# Patient Record
Sex: Male | Born: 2000
Health system: Southern US, Community
[De-identification: ages and names within clinical notes are randomized; demographics above are authoritative.]

## PROBLEM LIST (undated history)

## (undated) DIAGNOSIS — Z8781 Personal history of (healed) traumatic fracture: Secondary | ICD-10-CM

## (undated) HISTORY — DX: Personal history of (healed) traumatic fracture: Z87.81

## (undated) HISTORY — PX: DENTAL RESTORATION/EXTRACTION WITH X-RAY: SHX5796

---
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
## 2021-01-01 ENCOUNTER — Other Ambulatory Visit (HOSPITAL_BASED_OUTPATIENT_CLINIC_OR_DEPARTMENT_OTHER): Payer: Self-pay | Admitting: Internal Medicine

## 2021-01-01 ENCOUNTER — Ambulatory Visit: Payer: 59 | Attending: Internal Medicine

## 2021-01-01 DIAGNOSIS — Z23 Encounter for immunization: Secondary | ICD-10-CM

## 2021-01-01 NOTE — Progress Notes (Signed)
   Covid-19 Vaccination Clinic  Name:  Jeffrey Brown    MRN: [id number] DOB: 2001/07/31  01/01/2021  [name] was observed post Covid-19 immunization for 15 minutes without incident. He was provided with Vaccine Information Sheet and instruction to access the V-Safe system.   [name] was instructed to call 911 with any severe reactions post vaccine: Marland Kitchen Difficulty breathing  . Swelling of face and throat  . A fast heartbeat  . A bad rash all over body  . Dizziness and weakness   Immunizations Administered    Name Date Dose VIS Date Route   Pfizer COVID-19 Vaccine 01/01/2021  2:04 PM 0.3 mL 10/14/2020 Intramuscular   Manufacturer: [employer], [employer]   Lot: [id number]   NDC: [address]-7

## 2021-01-04 MED FILL — PFIZER-BIONTECH COVID-19 VA: 30 | 21 days supply | Qty: 0 | Fill #0

## 2021-05-12 DIAGNOSIS — F640 Transsexualism: Secondary | ICD-10-CM | POA: Diagnosis not present

## 2021-05-12 DIAGNOSIS — Z79899 Other long term (current) drug therapy: Secondary | ICD-10-CM | POA: Diagnosis not present

## 2021-08-11 ENCOUNTER — Other Ambulatory Visit (HOSPITAL_COMMUNITY): Payer: Self-pay

## 2021-08-11 DIAGNOSIS — Z79899 Other long term (current) drug therapy: Secondary | ICD-10-CM | POA: Diagnosis not present

## 2021-08-11 DIAGNOSIS — F640 Transsexualism: Secondary | ICD-10-CM | POA: Diagnosis not present

## 2021-08-11 MED ORDER — ESTRADIOL 2 MG PO TABS
2.0000 mg | ORAL_TABLET | Freq: Two times a day (BID) | ORAL | 0 refills | Status: DC
  Filled 2021-08-11: qty 50, 25d supply, fill #0
  Filled 2021-08-11: qty 130, 65d supply, fill #0
  Filled 2021-08-25: qty 180, 90d supply, fill #0

## 2021-08-11 MED ORDER — SPIRONOLACTONE 100 MG PO TABS
100.0000 mg | ORAL_TABLET | Freq: Two times a day (BID) | ORAL | 0 refills | Status: DC
Start: 2021-08-11 — End: 2021-10-06
  Filled 2021-08-11 – 2021-08-25 (×2): qty 180, 90d supply, fill #0

## 2021-08-19 ENCOUNTER — Other Ambulatory Visit (HOSPITAL_COMMUNITY): Payer: Self-pay

## 2021-08-25 ENCOUNTER — Other Ambulatory Visit (HOSPITAL_COMMUNITY): Payer: Self-pay

## 2021-10-06 ENCOUNTER — Other Ambulatory Visit (HOSPITAL_COMMUNITY): Payer: Self-pay

## 2021-10-06 DIAGNOSIS — F640 Transsexualism: Secondary | ICD-10-CM | POA: Diagnosis not present

## 2021-10-06 DIAGNOSIS — F649 Gender identity disorder, unspecified: Secondary | ICD-10-CM | POA: Diagnosis not present

## 2021-10-06 DIAGNOSIS — Z79899 Other long term (current) drug therapy: Secondary | ICD-10-CM | POA: Diagnosis not present

## 2021-10-06 MED ORDER — SPIRONOLACTONE 100 MG PO TABS
100.0000 mg | ORAL_TABLET | Freq: Two times a day (BID) | ORAL | 0 refills | Status: DC
  Filled 2021-11-19: qty 180, 90d supply, fill #0

## 2021-10-06 MED ORDER — ESTRADIOL 2 MG PO TABS
2.0000 mg | ORAL_TABLET | Freq: Two times a day (BID) | ORAL | 0 refills | Status: DC
Start: 2021-10-06 — End: 2022-01-04
  Filled 2021-11-19: qty 180, 90d supply, fill #0

## 2021-10-06 MED ORDER — PROGESTERONE MICRONIZED 100 MG PO CAPS
100.0000 mg | ORAL_CAPSULE | Freq: Every day | ORAL | 0 refills | Status: DC
  Filled 2021-10-06: qty 90, 90d supply, fill #0

## 2021-10-11 ENCOUNTER — Other Ambulatory Visit (HOSPITAL_COMMUNITY): Payer: Self-pay

## 2021-11-19 ENCOUNTER — Other Ambulatory Visit (HOSPITAL_COMMUNITY): Payer: Self-pay

## 2022-01-04 ENCOUNTER — Other Ambulatory Visit (HOSPITAL_COMMUNITY): Payer: Self-pay

## 2022-01-04 DIAGNOSIS — F649 Gender identity disorder, unspecified: Secondary | ICD-10-CM | POA: Diagnosis not present

## 2022-01-04 MED ORDER — PROGESTERONE MICRONIZED 100 MG PO CAPS
100.0000 mg | ORAL_CAPSULE | Freq: Every day | ORAL | 0 refills | Status: AC
  Filled 2022-01-04 – 2022-02-16 (×2): qty 90, 90d supply, fill #0
  Filled 2022-05-24: qty 90, 90d supply, fill #1

## 2022-01-04 MED ORDER — ESTRADIOL 2 MG PO TABS
2.0000 mg | ORAL_TABLET | Freq: Two times a day (BID) | ORAL | 0 refills | Status: AC
  Filled 2022-01-04: qty 360, 180d supply, fill #0
  Filled 2022-02-16: qty 180, 90d supply, fill #0
  Filled 2022-05-24: qty 180, 90d supply, fill #1

## 2022-01-04 MED ORDER — SPIRONOLACTONE 100 MG PO TABS
100.0000 mg | ORAL_TABLET | Freq: Two times a day (BID) | ORAL | 0 refills | Status: AC
  Filled 2022-01-04: qty 360, 180d supply, fill #0
  Filled 2022-02-16: qty 180, 90d supply, fill #0
  Filled 2022-05-24: qty 180, 90d supply, fill #1

## 2022-01-12 ENCOUNTER — Other Ambulatory Visit (HOSPITAL_COMMUNITY): Payer: Self-pay

## 2022-02-16 ENCOUNTER — Other Ambulatory Visit (HOSPITAL_COMMUNITY): Payer: Self-pay

## 2022-05-24 ENCOUNTER — Other Ambulatory Visit (HOSPITAL_COMMUNITY): Payer: Self-pay

## 2022-08-26 ENCOUNTER — Other Ambulatory Visit (HOSPITAL_COMMUNITY): Payer: Self-pay

## 2022-08-26 MED ORDER — ESTRADIOL 2 MG PO TABS
2.0000 mg | ORAL_TABLET | Freq: Two times a day (BID) | ORAL | 0 refills | Status: DC
  Filled 2022-08-26: qty 60, 30d supply, fill #0

## 2022-08-26 MED ORDER — SPIRONOLACTONE 100 MG PO TABS
100.0000 mg | ORAL_TABLET | Freq: Two times a day (BID) | ORAL | 0 refills | Status: DC
  Filled 2022-08-26: qty 60, 30d supply, fill #0

## 2022-08-26 MED ORDER — PROGESTERONE MICRONIZED 100 MG PO CAPS
100.0000 mg | ORAL_CAPSULE | Freq: Every day | ORAL | 0 refills | Status: DC
  Filled 2022-08-26: qty 30, 30d supply, fill #0

## 2022-09-06 ENCOUNTER — Other Ambulatory Visit (HOSPITAL_COMMUNITY): Payer: Self-pay

## 2022-09-06 DIAGNOSIS — Z79899 Other long term (current) drug therapy: Secondary | ICD-10-CM | POA: Diagnosis not present

## 2022-09-06 DIAGNOSIS — F649 Gender identity disorder, unspecified: Secondary | ICD-10-CM | POA: Diagnosis not present

## 2022-09-06 MED ORDER — PROGESTERONE MICRONIZED 100 MG PO CAPS
100.0000 mg | ORAL_CAPSULE | Freq: Every day | ORAL | 0 refills | Status: DC
  Filled 2022-09-06 – 2022-09-27 (×2): qty 90, 90d supply, fill #0
  Filled 2023-01-02: qty 90, 90d supply, fill #1

## 2022-09-06 MED ORDER — SPIRONOLACTONE 100 MG PO TABS
100.0000 mg | ORAL_TABLET | Freq: Two times a day (BID) | ORAL | 0 refills | Status: DC
  Filled 2022-09-06 – 2022-09-27 (×2): qty 180, 90d supply, fill #0
  Filled 2023-01-02: qty 180, 90d supply, fill #1

## 2022-09-06 MED ORDER — ESTRADIOL 2 MG PO TABS
2.0000 mg | ORAL_TABLET | Freq: Two times a day (BID) | ORAL | 0 refills | Status: DC
  Filled 2022-09-06 – 2022-09-27 (×2): qty 180, 90d supply, fill #0
  Filled 2023-01-02: qty 180, 90d supply, fill #1

## 2022-09-27 ENCOUNTER — Other Ambulatory Visit (HOSPITAL_COMMUNITY): Payer: Self-pay

## 2023-01-02 ENCOUNTER — Other Ambulatory Visit (HOSPITAL_COMMUNITY): Payer: Self-pay

## 2023-01-03 ENCOUNTER — Other Ambulatory Visit (HOSPITAL_COMMUNITY): Payer: Self-pay

## 2023-04-11 ENCOUNTER — Other Ambulatory Visit (HOSPITAL_COMMUNITY): Payer: Self-pay

## 2023-04-11 MED ORDER — PROGESTERONE MICRONIZED 100 MG PO CAPS
100.0000 mg | ORAL_CAPSULE | Freq: Every day | ORAL | 0 refills | Status: DC
  Filled 2023-04-11: qty 30, 30d supply, fill #0

## 2023-04-11 MED ORDER — ESTRADIOL 2 MG PO TABS
2.0000 mg | ORAL_TABLET | Freq: Two times a day (BID) | ORAL | 0 refills | Status: DC
  Filled 2023-04-11: qty 60, 30d supply, fill #0

## 2023-04-11 MED ORDER — SPIRONOLACTONE 100 MG PO TABS
100.0000 mg | ORAL_TABLET | Freq: Two times a day (BID) | ORAL | 0 refills | Status: DC
  Filled 2023-04-11: qty 60, 30d supply, fill #0

## 2023-05-04 ENCOUNTER — Other Ambulatory Visit (HOSPITAL_COMMUNITY): Payer: Self-pay

## 2023-05-04 MED ORDER — PROGESTERONE MICRONIZED 100 MG PO CAPS
100.0000 mg | ORAL_CAPSULE | Freq: Every day | ORAL | 0 refills | Status: DC
  Filled 2023-05-04 – 2023-05-15 (×2): qty 90, 90d supply, fill #0

## 2023-05-04 MED ORDER — SPIRONOLACTONE 100 MG PO TABS
100.0000 mg | ORAL_TABLET | Freq: Two times a day (BID) | ORAL | 0 refills | Status: DC
  Filled 2023-05-04 – 2023-05-15 (×2): qty 180, 90d supply, fill #0

## 2023-05-04 MED ORDER — ESTRADIOL 2 MG PO TABS
2.0000 mg | ORAL_TABLET | Freq: Three times a day (TID) | ORAL | 0 refills | Status: DC
  Filled 2023-05-04 – 2023-05-15 (×3): qty 270, 90d supply, fill #0

## 2023-05-05 ENCOUNTER — Other Ambulatory Visit: Payer: Self-pay

## 2023-05-05 ENCOUNTER — Other Ambulatory Visit (HOSPITAL_COMMUNITY): Payer: Self-pay

## 2023-05-12 ENCOUNTER — Other Ambulatory Visit (HOSPITAL_COMMUNITY): Payer: Self-pay

## 2023-05-15 ENCOUNTER — Other Ambulatory Visit (HOSPITAL_COMMUNITY): Payer: Self-pay

## 2023-08-02 ENCOUNTER — Other Ambulatory Visit (HOSPITAL_COMMUNITY): Payer: Self-pay

## 2023-08-02 MED ORDER — SPIRONOLACTONE 100 MG PO TABS
100.0000 mg | ORAL_TABLET | Freq: Two times a day (BID) | ORAL | 0 refills | Status: DC
  Filled 2023-08-16: qty 180, 90d supply, fill #0

## 2023-08-02 MED ORDER — ESTRADIOL 2 MG PO TABS
2.0000 mg | ORAL_TABLET | Freq: Three times a day (TID) | ORAL | 0 refills | Status: DC
  Filled 2023-08-16: qty 270, 90d supply, fill #0

## 2023-08-02 MED ORDER — PROGESTERONE MICRONIZED 100 MG PO CAPS
100.0000 mg | ORAL_CAPSULE | Freq: Every evening | ORAL | 0 refills | Status: DC
  Filled 2023-08-16: qty 90, 90d supply, fill #0

## 2023-08-02 MED ORDER — TESTOSTERONE POWD: 0 refills | Status: AC

## 2023-08-16 ENCOUNTER — Other Ambulatory Visit (HOSPITAL_COMMUNITY): Payer: Self-pay

## 2023-08-17 ENCOUNTER — Other Ambulatory Visit (HOSPITAL_COMMUNITY): Payer: Self-pay

## 2023-08-25 ENCOUNTER — Other Ambulatory Visit (HOSPITAL_COMMUNITY): Payer: Self-pay

## 2023-08-31 DIAGNOSIS — Z0100 Encounter for examination of eyes and vision without abnormal findings: Secondary | ICD-10-CM | POA: Diagnosis not present

## 2023-10-16 ENCOUNTER — Ambulatory Visit (HOSPITAL_COMMUNITY)
Admission: EM | Admit: 2023-10-16 | Discharge: 2023-10-16 | Disposition: A | Discharge status: Home / Self Care | Payer: Commercial Managed Care - PPO | Attending: Internal Medicine | Admitting: Internal Medicine

## 2023-10-16 ENCOUNTER — Encounter (HOSPITAL_COMMUNITY): Payer: Self-pay

## 2023-10-16 DIAGNOSIS — J028 Acute pharyngitis due to other specified organisms: Secondary | ICD-10-CM | POA: Diagnosis not present

## 2023-10-16 DIAGNOSIS — K6289 Other specified diseases of anus and rectum: Secondary | ICD-10-CM

## 2023-10-16 LAB — POCT RAPID STREP A (OFFICE): Rapid Strep A Screen: NEGATIVE

## 2023-10-16 MED ORDER — AMOXICILLIN 500 MG PO CAPS
500.0000 mg | ORAL_CAPSULE | Freq: Three times a day (TID) | ORAL | 0 refills | Status: AC
Start: 2023-10-16 — End: ?

## 2023-10-16 NOTE — Discharge Instructions (Addendum)
Pharynigitis but strep is negative. Given physical exam findings will treat with the following:  Amoxicillin 500mg  3 times daily for 7 days. Take it with food.  Ibuprofen as needed for inflammation May use over the counter lozenges for sore throat Return to urgent care or PCP if symptoms worsen or fail to resolve.    Likely rectal fissure vs a grade 1 hemorrhoid. Can treat with the following: Miralax 1-2 times daily until stool is soft but not liquid May use over the counter hydrocortisone or preparation H twice daily until resolved If symptoms don't resolve, then make an appointment with PCP or gastroenterologist.

## 2023-10-16 NOTE — ED Notes (Signed)
No answer from lobby  

## 2023-10-16 NOTE — ED Provider Notes (Signed)
[facility]    CSN: [id number] Arrival date & time: 10/16/23  1635      History   Chief Complaint Chief Complaint  Patient presents with   Sore Throat    HPI Jeffrey Brown is a 22 y.o. male.   5 days ago neck pain with headache and fatigue, then developed sore throat 3 days ago and then yesterday really started seeing inflammation and Jeffrey Brown spots. Positive for chills but no fevers.  Denies congestion, ear pain, eye pain, sinus congestion, urinary symptoms or abdominal pain.  Denies nausea or vomiting  Also notes that he had some blood in the stool yesterday. This was on the toilet paper when wiping but not in the stool itself. Only a small amount. Does have some sharp pains when having bowel movements that has worsened over last month. Doesn't usually strain to have BM's. Stool not typically large. Usually goes daily. Every other day has been using a pill to help with BM's since this has happened.  Denies any history of this.    Sore Throat Pertinent negatives include no chest pain, no abdominal pain and no shortness of breath.    Past Medical History:  Diagnosis Date   Hx of fracture of arm    left elbow    Hx of fracture of finger    right  pinky     Patient Active Problem List   Diagnosis Date Noted   BMI (body mass index), pediatric, greater than or equal to 95% for age 27/02/2014   ADHD (attention deficit hyperactivity disorder) 07/18/2013   WCC (well child check) 07/18/2013    Past Surgical History:  Procedure Laterality Date   DENTAL RESTORATION/EXTRACTION WITH X-RAY         Home Medications    Prior to Admission medications   Medication Sig Start Date End Date Taking? Authorizing Provider  estradiol (ESTRACE) 2 MG tablet Dissolve 1 tablet under the tongue 2 times a day 01/04/22     estradiol (ESTRACE) 2 MG tablet Dissolve 1 tablet (2 mg total) under the tongue 2 (two) times daily. 09/06/22     estradiol (ESTRACE) 2 MG tablet Take 1 tablet (2  mg total) by mouth 3 (three) times daily. 08/02/23     ibuprofen (ADVIL,MOTRIN) 800 MG tablet Take 1 tablet (800 mg total) by mouth every 8 (eight) hours as needed for mild pain or moderate pain. 12/21/17   Sherrilee Gilles, NP  loratadine (CLARITIN) 10 MG tablet Take 10 mg by mouth daily as needed for allergies.    [provider]  progesterone (PROMETRIUM) 100 MG capsule Take 1 capsule (100 mg total) by mouth at bedtime. 01/04/22     progesterone (PROMETRIUM) 100 MG capsule Take 1 capsule (100 mg total) by mouth at bedtime. 09/06/22     progesterone (PROMETRIUM) 100 MG capsule Take 1 capsule (100 mg total) by mouth at bedtime. 08/02/23     spironolactone (ALDACTONE) 100 MG tablet Take 1 tablet (100 mg total) by mouth 2 (two) times daily. 01/04/22     spironolactone (ALDACTONE) 100 MG tablet Take 1 tablet (100 mg total) by mouth 2 (two) times daily. 09/06/22     spironolactone (ALDACTONE) 100 MG tablet Take 1 tablet (100 mg total) by mouth 2 (two) times daily. 08/02/23     Testosterone POWD Apply 5 mg to genitals once weekly at bedtime. 08/02/23       Family History Family History  Problem Relation Age of Onset  Hashimoto's thyroiditis Sister        hashimotos  in sister    Renal cancer Unknown        mgm   Testicular cancer Father        father    Sjogren's syndrome Unknown        pgm     Social History Social History   Tobacco Use   Smoking status: Passive Smoke Exposure - Never Smoker   Smokeless tobacco: Never  Substance Use Topics   Alcohol use: No     Allergies   Patient has no known allergies.   Review of Systems Review of Systems  Constitutional:  Positive for chills. Negative for fever.  HENT:  Positive for sore throat and trouble swallowing. Negative for congestion and ear pain.   Eyes:  Negative for pain and visual disturbance.  Respiratory:  Negative for cough and shortness of breath.   Cardiovascular:  Negative for chest pain and palpitations.   Gastrointestinal:  Positive for rectal pain (With BMs mostly, sharp). Negative for abdominal pain and vomiting.       Blood on toilet paper with wiping  Genitourinary:  Negative for dysuria and hematuria.  Musculoskeletal:  Negative for arthralgias and back pain.  Skin:  Negative for color change and rash.  Neurological:  Negative for seizures and syncope.  All other systems reviewed and are negative.    Physical Exam Triage Vital Signs ED Triage Vitals  Encounter Vitals Group     BP 10/16/23 1800 124/71     Systolic BP Percentile --      Diastolic BP Percentile --      Pulse Rate 10/16/23 1800 87     Resp 10/16/23 1800 16     Temp 10/16/23 1800 99.2 F (37.3 C)     Temp Source 10/16/23 1800 Oral     SpO2 10/16/23 1800 97 %     Weight --      Height --      Head Circumference --      Peak Flow --      Pain Score 10/16/23 1801 2     Pain Loc --      Pain Education --      Exclude from Growth Chart --    No data found.  Updated Vital Signs BP 124/71 (BP Location: Right Arm)   Pulse 87   Temp 99.2 F (37.3 C) (Oral)   Resp 16   SpO2 97%   Visual Acuity Right Eye Distance:   Left Eye Distance:   Bilateral Distance:    Right Eye Near:   Left Eye Near:    Bilateral Near:     Physical Exam Vitals and nursing note reviewed.  Constitutional:      General: He is not in acute distress.    Appearance: He is well-developed.  HENT:     Head: Normocephalic and atraumatic.     Right Ear: Tympanic membrane and ear canal normal.     Left Ear: Tympanic membrane and ear canal normal.     Mouth/Throat:     Mouth: Mucous membranes are moist.     Pharynx: Pharyngeal swelling, oropharyngeal exudate and posterior oropharyngeal erythema present.  Eyes:     Conjunctiva/sclera: Conjunctivae normal.  Cardiovascular:     Rate and Rhythm: Normal rate and regular rhythm.     Heart sounds: No murmur heard. Pulmonary:     Effort: Pulmonary effort is normal. No respiratory  distress.  Breath sounds: Normal breath sounds.  Abdominal:     Palpations: Abdomen is soft.     Tenderness: There is no abdominal tenderness.  Musculoskeletal:        General: No swelling.     Cervical back: Neck supple.  Skin:    General: Skin is warm and dry.     Capillary Refill: Capillary refill takes less than 2 seconds.  Neurological:     Mental Status: He is alert.  Psychiatric:        Mood and Affect: Mood normal.      UC Treatments / Results  Labs (all labs ordered are listed, but only abnormal results are displayed) Labs Reviewed  POCT RAPID STREP A (OFFICE)    EKG   Radiology No results found.  Procedures Procedures (including critical care time)  Medications Ordered in UC Medications - No data to display  Initial Impression / Assessment and Plan / UC Course  I have reviewed the triage vital signs and the nursing notes.  Pertinent labs & imaging results that were available during my care of the patient were reviewed by me and considered in my medical decision making (see chart for details).     Pharyngitis due to other organism  Rectal pain   Pharynigitis but strep is negative. Given physical exam findings will treat with the following:  Amoxicillin 500mg  3 times daily for 7 days. Take it with food.  Ibuprofen as needed for inflammation May use over the counter lozenges for sore throat Return to urgent care or PCP if symptoms worsen or fail to resolve.    Likely rectal fissure vs a grade 1 hemorrhoid. Can treat with the following: Miralax 1-2 times daily until stool is soft but not liquid May use over the counter hydrocortisone or preparation H twice daily until resolved If symptoms don't resolve, then make an appointment with PCP or gastroenterologist.   Final Clinical Impressions(s) / UC Diagnoses   Final diagnoses:  None   Discharge Instructions   None    ED Prescriptions   None    PDMP not reviewed this encounter.   Landis Martins, [place] 10/16/23 1834

## 2023-10-16 NOTE — ED Triage Notes (Signed)
Pt c/o sore throat x4 days. States today has swelling to rt side of throat. Taking OTC with little relief.

## 2023-11-20 ENCOUNTER — Other Ambulatory Visit (HOSPITAL_COMMUNITY): Payer: Self-pay

## 2023-11-20 MED ORDER — PROGESTERONE MICRONIZED 100 MG PO CAPS
100.0000 mg | ORAL_CAPSULE | Freq: Every evening | ORAL | 0 refills | Status: DC
  Filled 2023-11-20: qty 30, 30d supply, fill #0

## 2023-11-20 MED ORDER — SPIRONOLACTONE 100 MG PO TABS
100.0000 mg | ORAL_TABLET | Freq: Two times a day (BID) | ORAL | 0 refills | Status: DC
  Filled 2023-11-20: qty 60, 30d supply, fill #0

## 2023-12-05 ENCOUNTER — Other Ambulatory Visit (HOSPITAL_COMMUNITY): Payer: Self-pay

## 2023-12-05 MED ORDER — SPIRONOLACTONE 100 MG PO TABS
100.0000 mg | ORAL_TABLET | Freq: Two times a day (BID) | ORAL | 0 refills | Status: DC
  Filled 2023-12-05 – 2023-12-25 (×2): qty 180, 90d supply, fill #0
  Filled 2024-04-03: qty 180, 90d supply, fill #1

## 2023-12-05 MED ORDER — ESTRADIOL 2 MG PO TABS
2.0000 mg | ORAL_TABLET | Freq: Three times a day (TID) | ORAL | 0 refills | Status: DC
  Filled 2023-12-05 – 2023-12-25 (×2): qty 270, 90d supply, fill #0
  Filled 2024-04-03: qty 270, 90d supply, fill #1

## 2023-12-05 MED ORDER — PROGESTERONE MICRONIZED 100 MG PO CAPS
100.0000 mg | ORAL_CAPSULE | Freq: Every evening | ORAL | 0 refills | Status: DC
  Filled 2023-12-05 – 2024-04-03 (×3): qty 90, 90d supply, fill #0
  Filled 2024-08-13: qty 90, 90d supply, fill #1

## 2023-12-06 ENCOUNTER — Other Ambulatory Visit (HOSPITAL_COMMUNITY): Payer: Self-pay

## 2023-12-18 ENCOUNTER — Other Ambulatory Visit (HOSPITAL_COMMUNITY): Payer: Self-pay

## 2023-12-25 ENCOUNTER — Other Ambulatory Visit (HOSPITAL_COMMUNITY): Payer: Self-pay

## 2023-12-25 ENCOUNTER — Other Ambulatory Visit: Payer: Self-pay

## 2024-01-04 ENCOUNTER — Other Ambulatory Visit (HOSPITAL_COMMUNITY): Payer: Self-pay

## 2024-04-03 ENCOUNTER — Other Ambulatory Visit (HOSPITAL_COMMUNITY): Payer: Self-pay

## 2024-08-13 ENCOUNTER — Other Ambulatory Visit (HOSPITAL_COMMUNITY): Payer: Self-pay

## 2024-08-13 MED ORDER — SPIRONOLACTONE 100 MG PO TABS
100.0000 mg | ORAL_TABLET | Freq: Two times a day (BID) | ORAL | 0 refills | Status: DC
  Filled 2024-08-13: qty 60, 30d supply, fill #0

## 2024-08-13 MED ORDER — ESTRADIOL 2 MG PO TABS
2.0000 mg | ORAL_TABLET | Freq: Three times a day (TID) | ORAL | 0 refills | Status: DC
  Filled 2024-08-13: qty 90, 30d supply, fill #0

## 2024-08-14 ENCOUNTER — Other Ambulatory Visit: Payer: Self-pay

## 2024-08-14 ENCOUNTER — Other Ambulatory Visit (HOSPITAL_COMMUNITY): Payer: Self-pay

## 2024-09-23 ENCOUNTER — Other Ambulatory Visit: Payer: Self-pay

## 2024-09-23 ENCOUNTER — Other Ambulatory Visit (HOSPITAL_COMMUNITY): Payer: Self-pay

## 2024-09-23 MED ORDER — SPIRONOLACTONE 100 MG PO TABS
100.0000 mg | ORAL_TABLET | Freq: Two times a day (BID) | ORAL | 0 refills | Status: DC
  Filled 2024-09-23: qty 60, 30d supply, fill #0

## 2024-09-23 MED ORDER — PROGESTERONE MICRONIZED 100 MG PO CAPS
100.0000 mg | ORAL_CAPSULE | Freq: Every evening | ORAL | 0 refills | Status: DC
  Filled 2024-09-23 – 2024-11-04 (×3): qty 30, 30d supply, fill #0

## 2024-09-23 MED ORDER — ESTRADIOL 2 MG PO TABS
2.0000 mg | ORAL_TABLET | Freq: Three times a day (TID) | ORAL | 0 refills | Status: DC
  Filled 2024-09-23: qty 90, 30d supply, fill #0

## 2024-09-26 ENCOUNTER — Other Ambulatory Visit (HOSPITAL_COMMUNITY): Payer: Self-pay

## 2024-09-26 DIAGNOSIS — F649 Gender identity disorder, unspecified: Secondary | ICD-10-CM | POA: Diagnosis not present

## 2024-09-26 MED ORDER — SPIRONOLACTONE 100 MG PO TABS
100.0000 mg | ORAL_TABLET | Freq: Two times a day (BID) | ORAL | 3 refills | Status: DC
  Filled 2024-09-26 – 2024-11-04 (×3): qty 180, 90d supply, fill #0

## 2024-09-26 MED ORDER — ESTRADIOL 2 MG PO TABS
2.0000 mg | ORAL_TABLET | Freq: Three times a day (TID) | ORAL | 3 refills | Status: DC
  Filled 2024-09-26 – 2024-11-04 (×3): qty 270, 90d supply, fill #0

## 2024-09-26 MED ORDER — PROGESTERONE MICRONIZED 100 MG PO CAPS
100.0000 mg | ORAL_CAPSULE | Freq: Every day | ORAL | 3 refills | Status: DC
  Filled 2024-09-26: qty 90, 90d supply, fill #0

## 2024-11-04 ENCOUNTER — Other Ambulatory Visit (HOSPITAL_COMMUNITY): Payer: Self-pay

## 2024-11-04 ENCOUNTER — Other Ambulatory Visit: Payer: Self-pay

## 2024-12-09 ENCOUNTER — Ambulatory Visit: Admitting: Nurse Practitioner

## 2024-12-09 ENCOUNTER — Encounter: Payer: Self-pay | Admitting: Nurse Practitioner

## 2024-12-09 VITALS — BP 106/58 | HR 81 | Wt 207.8 lb

## 2024-12-09 DIAGNOSIS — Z13220 Encounter for screening for lipoid disorders: Secondary | ICD-10-CM | POA: Diagnosis not present

## 2024-12-09 DIAGNOSIS — K6289 Other specified diseases of anus and rectum: Secondary | ICD-10-CM | POA: Diagnosis not present

## 2024-12-09 DIAGNOSIS — Z0000 Encounter for general adult medical examination without abnormal findings: Secondary | ICD-10-CM

## 2024-12-09 DIAGNOSIS — R0981 Nasal congestion: Secondary | ICD-10-CM

## 2024-12-09 DIAGNOSIS — Z113 Encounter for screening for infections with a predominantly sexual mode of transmission: Secondary | ICD-10-CM

## 2024-12-09 DIAGNOSIS — Z1329 Encounter for screening for other suspected endocrine disorder: Secondary | ICD-10-CM

## 2024-12-09 MED ORDER — FLUTICASONE PROPIONATE 50 MCG/ACT NA SUSP: 2.0000 | Freq: Every day | NASAL | 6 refills | Status: AC

## 2024-12-09 NOTE — Progress Notes (Signed)
 Subjective   Patient ID: Jeffrey Brown, male    DOB: [birth year], 23 y.o.   MRN: [id number]  Chief Complaint  Patient presents with   Establish Care    Rectal pain, may be due to tears or fissures,    Fatigue   Nasal Congestion   Labs Only    Labs for overall health especially estrogen levels     Referring provider: No ref. provider found  STEPHEN BARUCH is a 23 y.o. male with Past Medical History: No date: Hx of fracture of arm     Comment:  left elbow  No date: Hx of fracture of finger     Comment:  right  pinky    HPI  Patient presents today to establish care.  Patient has been having issues with rectal pain.  He denies any tears to his rectum.  He denies any constipation.  Denies any blood in stool.  We will place a referral to GI.  Patient does have nasal congestion.  We will order Flonase .  Patient is requesting labs including estrogen for transgender care.  We discussed that we do not do transgender care at this office.  Patient is currently under the care of [facility] for transgender care.  He will need to continue to go there for the transgender care but can have primary care services through this office. Denies f/c/s, n/v/d, hemoptysis, PND, leg swelling Denies chest pain or edema      Allergies[1]  Immunization History  Administered Date(s) Administered   DTP 04/19/2002, 06/14/2002, 08/30/2002, 08/28/2003   HIB (PRP-OMP) 04/19/2002, 06/14/2002, 03/26/2003   HPV 9-valent 08/26/2019, 11/04/2019   Hepatitis A 04/24/2007, 01/14/2008   Hepatitis B 04/19/2002, 06/14/2002, 01/28/2004   Influenza Whole 01/14/2008   MMR 03/26/2003, 04/24/2007   Meningococcal B, OMV 08/26/2019   Meningococcal Conjugate 07/18/2013   Meningococcal Mcv4o 08/26/2019   OPV 04/19/2002, 06/14/2002, 03/26/2003, 04/24/2007   PFIZER(Purple Top)SARS-COV-2 Vaccination 01/01/2021   Pneumococcal Conjugate-13 04/19/2002, 06/14/2002, 08/30/2002, 08/28/2003   Td 04/24/2007   Tdap 07/18/2013    Varicella 03/26/2003, 01/14/2008    Tobacco History: Tobacco Use History[2] Counseling given: Not Answered   Outpatient Encounter Medications as of 12/09/2024  Medication Sig   fluticasone  (FLONASE ) 50 MCG/ACT nasal spray Place 2 sprays into both nostrils daily.   amoxicillin  (AMOXIL ) 500 MG capsule Take 1 capsule (500 mg total) by mouth 3 (three) times daily.   estradiol  (ESTRACE ) 2 MG tablet Dissolve 1 tablet under the tongue 2 times a day   ibuprofen  (ADVIL ,MOTRIN ) 800 MG tablet Take 1 tablet (800 mg total) by mouth every 8 (eight) hours as needed for mild pain or moderate pain.   loratadine (CLARITIN) 10 MG tablet Take 10 mg by mouth daily as needed for allergies.   progesterone  (PROMETRIUM ) 100 MG capsule Take 1 capsule (100 mg total) by mouth at bedtime.   spironolactone  (ALDACTONE ) 100 MG tablet Take 1 tablet (100 mg total) by mouth 2 (two) times daily.   Testosterone  POWD Apply 5 mg to genitals once weekly at bedtime.   [DISCONTINUED] estradiol  (ESTRACE ) 2 MG tablet Dissolve 1 tablet (2 mg total) under the tongue 2 (two) times daily.   [DISCONTINUED] estradiol  (ESTRACE ) 2 MG tablet Take 1 tablet (2 mg total) by mouth 3 (three) times daily.   [DISCONTINUED] estradiol  (ESTRACE ) 2 MG tablet Take 1 tablet (2 mg total) by mouth 3 (three) times daily.   [DISCONTINUED] progesterone  (PROMETRIUM ) 100 MG capsule Take 1 capsule (100 mg total) by mouth  at bedtime.   [DISCONTINUED] progesterone  (PROMETRIUM ) 100 MG capsule Take 1 capsule (100 mg total) by mouth at bedtime.   [DISCONTINUED] progesterone  (PROMETRIUM ) 100 MG capsule Take 1 capsule (100 mg total) by mouth every evening.   [DISCONTINUED] progesterone  (PROMETRIUM ) 100 MG capsule Take 1 capsule (100 mg total) by mouth every night.   [DISCONTINUED] spironolactone  (ALDACTONE ) 100 MG tablet Take 1 tablet (100 mg total) by mouth 2 (two) times daily.   [DISCONTINUED] spironolactone  (ALDACTONE ) 100 MG tablet Take 1 tablet (100 mg total) by  mouth 2 (two) times daily.   [DISCONTINUED] spironolactone  (ALDACTONE ) 100 MG tablet Take 1 tablet (100 mg total) by mouth 2 (two) times daily.   No facility-administered encounter medications on file as of 12/09/2024.    Review of Systems  Review of Systems  Constitutional: Negative.   HENT: Negative.    Cardiovascular: Negative.   Gastrointestinal: Negative.   Allergic/Immunologic: Negative.   Neurological: Negative.   Psychiatric/Behavioral: Negative.       Objective:   BP (!) 106/58 (BP Location: Left Arm, Patient Position: Sitting, Cuff Size: Large)   Pulse 81   Wt 207 lb 12.8 oz (94.3 kg)   SpO2 100%   Wt Readings from Last 5 Encounters:  12/09/24 207 lb 12.8 oz (94.3 kg)  08/27/19 197 lb (89.4 kg) (94%, Z= 1.58)*  12/21/17 181 lb 3.5 oz (82.2 kg) (94%, Z= 1.57)*  02/23/17 181 lb 12.8 oz (82.5 kg) (97%, Z= 1.83)*  12/06/15 149 lb 1 oz (67.6 kg) (92%, Z= 1.41)*   * Growth percentiles are based on CDC (Boys, 2-20 Years) data.     Physical Exam Vitals and nursing note reviewed.  Constitutional:      General: He is not in acute distress.    Appearance: He is well-developed.  Cardiovascular:     Rate and Rhythm: Normal rate and regular rhythm.  Pulmonary:     Effort: Pulmonary effort is normal.     Breath sounds: Normal breath sounds.  Skin:    General: Skin is warm and dry.  Neurological:     Mental Status: He is alert and oriented to person, place, and time.       Assessment & Plan:   Rectal pain -     Ambulatory referral to Gastroenterology  Nasal congestion -     Fluticasone  Propionate; Place 2 sprays into both nostrils daily.  Dispense: 16 g; Refill: 6  Screen for STD (sexually transmitted disease) -     RPR+HIV+GC+CT Panel  Routine adult health maintenance -     CBC -     Comprehensive metabolic panel with GFR  Lipid screening -     Lipid panel  Thyroid disorder screen -     TSH     Return in about 1 year (around 12/09/2025) for  Physical.   Bascom GORMAN Borer, NP 12/09/2024     [1] No Known Allergies [2]  Social History Tobacco Use  Smoking Status Passive Smoke Exposure - Never Smoker  Smokeless Tobacco Never

## 2024-12-10 LAB — COMPREHENSIVE METABOLIC PANEL WITH GFR
ALT: 18 IU/L (ref 0–44)
AST: 20 IU/L (ref 0–40)
Albumin: 4.2 g/dL — ABNORMAL LOW (ref 4.3–5.2)
Alkaline Phosphatase: 63 IU/L (ref 47–123)
BUN/Creatinine Ratio: 20 (ref 9–20)
BUN: 15 mg/dL (ref 6–20)
Bilirubin Total: 0.3 mg/dL (ref 0.0–1.2)
CO2: 23 mmol/L (ref 20–29)
Calcium: 9.3 mg/dL (ref 8.7–10.2)
Chloride: 101 mmol/L (ref 96–106)
Creatinine, Ser: 0.74 mg/dL — ABNORMAL LOW (ref 0.76–1.27)
Globulin, Total: 2.5 g/dL (ref 1.5–4.5)
Glucose: 86 mg/dL (ref 70–99)
Potassium: 4.4 mmol/L (ref 3.5–5.2)
Sodium: 139 mmol/L (ref 134–144)
Total Protein: 6.7 g/dL (ref 6.0–8.5)
eGFR: 131 mL/min/1.73 (ref >59)

## 2024-12-10 LAB — LIPID PANEL
Chol/HDL Ratio: 2 ratio (ref 0.0–5.0)
Cholesterol, Total: 133 mg/dL (ref 100–199)
HDL: 66 mg/dL (ref >39)
LDL Chol Calc (NIH): 51 mg/dL (ref 0–99)
Triglycerides: 85 mg/dL (ref 0–149)
VLDL Cholesterol Cal: 16 mg/dL (ref 5–40)

## 2024-12-10 LAB — CBC
Hematocrit: 41.9 % (ref 37.5–51.0)
Hemoglobin: 14.4 g/dL (ref 13.0–17.7)
MCH: 30.1 pg (ref 26.6–33.0)
MCHC: 34.4 g/dL (ref 31.5–35.7)
MCV: 88 fL (ref 79–97)
Platelets: 192 x10E3/uL (ref 150–450)
RBC: 4.78 x10E6/uL (ref 4.14–5.80)
RDW: 11.8 % (ref 11.6–15.4)
WBC: 5.7 x10E3/uL (ref 3.4–10.8)

## 2024-12-10 LAB — TSH: TSH: 1.93 u[IU]/mL (ref 0.450–4.500)

## 2024-12-11 ENCOUNTER — Ambulatory Visit: Payer: Self-pay | Admitting: Nurse Practitioner

## 2024-12-11 LAB — RPR+HIV+GC+CT PANEL
Chlamydia trachomatis, NAA: NEGATIVE
HIV Screen 4th Generation wRfx: NONREACTIVE
Neisseria Gonorrhoeae by PCR: NEGATIVE
RPR Ser Ql: NONREACTIVE
